# Patient Record
Sex: Male | Born: 1980 | Race: Black or African American | Hispanic: No | Smoking: Current every day smoker
Health system: Southern US, Community
[De-identification: ages and names within clinical notes are randomized; demographics above are authoritative.]

## PROBLEM LIST (undated history)

## (undated) DIAGNOSIS — W3400XA Accidental discharge from unspecified firearms or gun, initial encounter: Secondary | ICD-10-CM

## (undated) HISTORY — PX: OTHER SURGICAL HISTORY: SHX169

---

## 2005-09-21 ENCOUNTER — Ambulatory Visit: Payer: Self-pay | Admitting: Cardiology

## 2005-09-23 ENCOUNTER — Emergency Department (HOSPITAL_COMMUNITY): Admission: EM | Admit: 2005-09-23 | Discharge: 2005-09-23 | Payer: Self-pay | Admitting: Emergency Medicine

## 2005-10-12 ENCOUNTER — Emergency Department (HOSPITAL_COMMUNITY): Admission: EM | Admit: 2005-10-12 | Discharge: 2005-10-12 | Payer: Self-pay | Admitting: *Deleted

## 2005-12-24 ENCOUNTER — Emergency Department (HOSPITAL_COMMUNITY): Admission: EM | Admit: 2005-12-24 | Discharge: 2005-12-25 | Payer: Self-pay | Admitting: Emergency Medicine

## 2006-12-11 ENCOUNTER — Emergency Department (HOSPITAL_COMMUNITY): Admission: EM | Admit: 2006-12-11 | Discharge: 2006-12-11 | Payer: Self-pay | Admitting: Emergency Medicine

## 2006-12-23 ENCOUNTER — Emergency Department (HOSPITAL_COMMUNITY): Admission: EM | Admit: 2006-12-23 | Discharge: 2006-12-23 | Payer: Self-pay | Admitting: Emergency Medicine

## 2010-06-08 ENCOUNTER — Emergency Department (HOSPITAL_COMMUNITY): Admission: EM | Admit: 2010-06-08 | Discharge: 2010-06-08 | Payer: Self-pay | Admitting: Emergency Medicine

## 2010-10-24 ENCOUNTER — Emergency Department (HOSPITAL_COMMUNITY)
Admission: EM | Admit: 2010-10-24 | Discharge: 2010-10-24 | Payer: Self-pay | Source: Home / Self Care | Admitting: Emergency Medicine

## 2011-04-06 NOTE — Consult Note (Signed)
NAME:  CAPERS, HAGMANN NO.:  [id number]   MEDICAL RECORD NO.:  [id number]          PATIENT TYPE:  EMS   LOCATION:  MAJO                         FACILITY:  [facility]   PHYSICIAN:  Anna Genre. Maisie Fus, M.D. [facility] OF BIRTH:  15-Dec-1980   DATE OF CONSULTATION:  09/23/2005  DATE OF DISCHARGE:                                   CONSULTATION   CHIEF COMPLAINT:  Questionable seizure activity.   HISTORY OF PRESENT ILLNESS:  Mr. Huot is a 30 year old gentleman with a  history of polysubstance abuse, notably cocaine use, who was brought to the  emergency department by EMS after his wife called out of concerns of a  possible seizure. The patient is unable to report significant contribution  to his history, but his wife indicates that he had been using cocaine  heavily over the last several days, when she noticed a loud noise last night  that prompted her to evaluate the situation. She found her husband writhing  around on the floor, appearing agitated with nonpurposeful movements that  she thought were consistent with seizure activity. He also was noted to be  incontinent, however, did not have any tongue biting. He denied any  significant complaints of chest pain during this episode. We are asked to  evaluate the patient based on the findings of his electrocardiogram.   PAST MEDICAL HISTORY:  Notable for an Axis I psychiatric diagnoses,  prompting treatment with valproic acid. Otherwise, essentially unremarkable  with the exception of his cocaine use.   MEDICATIONS:  The patient takes valproic acid. The dose of his medication is  not known at this time.   SOCIAL HISTORY:  The patient was recently discharged from prison. He is not  currently working. He is married and his wife has 4 children. He uses  alcohol intermittently and smokes cigarettes intermittently as well.   ALLERGIES:  No known drug allergies.   REVIEW OF SYSTEMS:  Notable for the neurologic symptoms reported  above.  Additionally, the patient has had some weight loss over the last several  months. Otherwise, remainder of review of systems is negative.   PHYSICAL EXAMINATION:  VITAL SIGNS:  Blood pressure today in the emergency  department was 132/68. His pulse was regular at 88.  LUNGS:  Clear to auscultation bilaterally. His JVP was not elevated.  CARDIOVASCULAR:  Examination showed an S3 with 1/6 systolic ejection murmur.  Otherwise, normal S1 and S2.  ABDOMEN:  Soft, nontender. Good bowel sounds.  EXTREMITIES:  No edema.  NEUROLOGIC:  He was somnolent, however, arousable. However, I could not get  the patient to string together intelligible words. Of note, he did receive  Ativan in the emergency department prior to my assessment of him.   LABORATORY DATA:  Head CT performed showed no evidence of any acute  intracranial process.   Chest x-ray was unremarkable.   His EKG showed normal sinus rhythm with a normal axis. There was significant  evidence voltage LVH criteria was met and patient had evidence of early  repolarization in multiple leads and had associated ST-T wave changes.  Overall impression  of the electrocardiogram was normal variant for a young  African-American male with ST-T wave changes recognized to be consistent  with this demographic.   IMPRESSION AND PLAN:  A 30 year old gentleman actively using cocaine. No  complaints of cardiac issues at this time with an EKG consistent with early  repolarization, left ventricular hypertrophy in a young African-American  male. His cardiac enzymes were evaluated. His troponin I and CK, CK-MB  remained negative during the point of care assessment. Myoglobin was  positive, consistent with the muscle breakdown that occurred with his  questionable seizure activity. From a cardiovascular standpoint, there are  no further recommendations from our standpoint. His EKG is considered within  the limits of normal for this particular  individual.   Thanks for the consultation.           ______________________________  Anna Genre Maisie Fus, M.D. [facility]     KLT/MEDQ  D:  09/23/2005  T:  09/23/2005  Job:  [id number]

## 2015-03-05 ENCOUNTER — Emergency Department (HOSPITAL_BASED_OUTPATIENT_CLINIC_OR_DEPARTMENT_OTHER)
Admission: EM | Admit: 2015-03-05 | Discharge: 2015-03-05 | Disposition: A | Discharge status: Home / Self Care | Payer: Medicaid Other | Attending: Emergency Medicine | Admitting: Emergency Medicine

## 2015-03-05 ENCOUNTER — Emergency Department (HOSPITAL_BASED_OUTPATIENT_CLINIC_OR_DEPARTMENT_OTHER): Payer: Medicaid Other

## 2015-03-05 ENCOUNTER — Encounter (HOSPITAL_BASED_OUTPATIENT_CLINIC_OR_DEPARTMENT_OTHER): Payer: Self-pay | Admitting: *Deleted

## 2015-03-05 DIAGNOSIS — Y998 Other external cause status: Secondary | ICD-10-CM | POA: Insufficient documentation

## 2015-03-05 DIAGNOSIS — Y9389 Activity, other specified: Secondary | ICD-10-CM | POA: Insufficient documentation

## 2015-03-05 DIAGNOSIS — Z720 Tobacco use: Secondary | ICD-10-CM | POA: Insufficient documentation

## 2015-03-05 DIAGNOSIS — W19XXXA Unspecified fall, initial encounter: Secondary | ICD-10-CM

## 2015-03-05 DIAGNOSIS — Y9289 Other specified places as the place of occurrence of the external cause: Secondary | ICD-10-CM | POA: Insufficient documentation

## 2015-03-05 DIAGNOSIS — Z79899 Other long term (current) drug therapy: Secondary | ICD-10-CM | POA: Diagnosis not present

## 2015-03-05 DIAGNOSIS — S20212A Contusion of left front wall of thorax, initial encounter: Secondary | ICD-10-CM | POA: Diagnosis not present

## 2015-03-05 DIAGNOSIS — W1839XA Other fall on same level, initial encounter: Secondary | ICD-10-CM | POA: Diagnosis not present

## 2015-03-05 DIAGNOSIS — S299XXA Unspecified injury of thorax, initial encounter: Secondary | ICD-10-CM | POA: Diagnosis present

## 2015-03-05 MED ORDER — CYCLOBENZAPRINE HCL 10 MG PO TABS: 10.0000 mg | ORAL_TABLET | Freq: Two times a day (BID) | ORAL | Status: AC | PRN

## 2015-03-05 MED ORDER — MELOXICAM 15 MG PO TABS: 15.0000 mg | ORAL_TABLET | Freq: Every day | ORAL | Status: AC

## 2015-03-05 NOTE — ED Notes (Signed)
Patient transported to X-ray via stretcher 

## 2015-03-05 NOTE — Discharge Instructions (Signed)
Take Mobic as needed for pain. Take Flexeril as needed for muscle spasm. Refer to attached documents for more information.  °

## 2015-03-05 NOTE — ED Notes (Signed)
Pt reports fell 2 days ago on left ribs- pain worse today with breathing and cough

## 2015-03-05 NOTE — ED Notes (Signed)
Presents to ED today w/ c/o pain at Left side, after sustaing a fall this past Thursday, pt states fell on wood steps, denies hitting head, alt LOC, pt states was accidental fall, "I tripped"

## 2015-03-05 NOTE — ED Notes (Signed)
No bruising, color changes noted on Lt chest, chest excursion very good

## 2015-03-05 NOTE — ED Provider Notes (Signed)
CSN: [id number]     Arrival date & time 03/05/15  1309 History   First MD Initiated Contact with Patient 03/05/15 1334     Chief Complaint  Patient presents with  . Fall     (Consider location/radiation/quality/duration/timing/severity/associated sxs/prior Treatment) HPI Comments: Patient is a 34 year old male who presents with a 2 day history of left rib pain that started after he fell and landed on his left side. Symptoms started suddenly. The pain is aching and severe without radiation. Palpation makes the pain worse. No alleviating factors. Patient denies head trauma or LOC. He tried tylenol for pain without relief. No other associated symptoms.    History reviewed. No pertinent past medical history. History reviewed. No pertinent past surgical history. No family history on file. History  Substance Use Topics  . Smoking status: Current Every Day Smoker    Types: Cigarettes  . Smokeless tobacco: Never Used  . Alcohol Use: No    Review of Systems  Constitutional: Negative for fever, chills and fatigue.  HENT: Negative for trouble swallowing.   Eyes: Negative for visual disturbance.  Respiratory: Negative for shortness of breath.   Cardiovascular: Positive for chest pain. Negative for palpitations.  Gastrointestinal: Negative for nausea, vomiting, abdominal pain and diarrhea.  Genitourinary: Negative for dysuria and difficulty urinating.  Musculoskeletal: Negative for arthralgias and neck pain.  Skin: Negative for color change.  Neurological: Negative for dizziness and weakness.  Psychiatric/Behavioral: Negative for dysphoric mood.      Allergies  Review of patient's allergies indicates no known allergies.  Home Medications   Prior to Admission medications   Medication Sig Start Date End Date Taking? Authorizing Provider  cetirizine (ZYRTEC) 10 MG tablet Take 10 mg by mouth daily.   Yes Historical Provider, MD   BP 120/69 mmHg  Pulse 71  Temp(Src) 97.9 F (36.6 C)  (Oral)  Resp 20  Ht 5\' 3"  (1.6 m)  Wt 135 lb (61.236 kg)  BMI 23.92 kg/m2  SpO2 100% Physical Exam  Constitutional: He is oriented to person, place, and time. He appears well-developed and well-nourished. No distress.  HENT:  Head: Normocephalic and atraumatic.  Eyes: Conjunctivae and EOM are normal.  Neck: Normal range of motion.  Cardiovascular: Normal rate and regular rhythm.  Exam reveals no gallop and no friction rub.   No murmur heard. Pulmonary/Chest: Effort normal and breath sounds normal. He has no wheezes. He has no rales. He exhibits tenderness.  Left lateral lower rib tenderness to palpation. No obvious deformity.   Abdominal: Soft. He exhibits no distension. There is no tenderness. There is no rebound.  Musculoskeletal: Normal range of motion.  Neurological: He is alert and oriented to person, place, and time. Coordination normal.  Speech is goal-oriented. Moves limbs without ataxia.   Skin: Skin is warm and dry.  Psychiatric: He has a normal mood and affect. His behavior is normal.  Nursing note and vitals reviewed.   ED Course  Procedures (including critical care time) Labs Review Labs Reviewed - No data to display  Imaging Review Dg Chest 2 View  03/05/2015   CLINICAL DATA:  Larey SeatFell 2 days ago and injured left sided chest.  EXAM: CHEST  2 VIEW  COMPARISON:  12/23/2006  FINDINGS: The heart size and mediastinal contours are within normal limits. Both lungs are clear. The visualized skeletal structures are unremarkable.  IMPRESSION: Normal chest x-ray.   Electronically Signed   By: Rudie MeyerP.  Gallerani M.D.   On: 03/05/2015 13:53  EKG Interpretation None      MDM   Final diagnoses:  Fall, initial encounter  Chest wall contusion, left, initial encounter    2:35 PM Chest xray shows no fracture. Vitals stable and patient afebrile. Patient will be discharged with mobic and flexeril for pain. No other injury.   Emilia Beck, PA-C 03/05/15 1445  Toy Cookey, MD 03/06/15 [phone]

## 2017-03-21 ENCOUNTER — Encounter (HOSPITAL_BASED_OUTPATIENT_CLINIC_OR_DEPARTMENT_OTHER): Payer: Self-pay | Admitting: *Deleted

## 2017-03-21 ENCOUNTER — Emergency Department (HOSPITAL_BASED_OUTPATIENT_CLINIC_OR_DEPARTMENT_OTHER): Payer: Medicaid Other

## 2017-03-21 ENCOUNTER — Emergency Department (HOSPITAL_BASED_OUTPATIENT_CLINIC_OR_DEPARTMENT_OTHER)
Admission: EM | Admit: 2017-03-21 | Discharge: 2017-03-21 | Disposition: A | Discharge status: Home / Self Care | Payer: Medicaid Other | Attending: Dermatology | Admitting: Dermatology

## 2017-03-21 DIAGNOSIS — Y999 Unspecified external cause status: Secondary | ICD-10-CM | POA: Insufficient documentation

## 2017-03-21 DIAGNOSIS — F17210 Nicotine dependence, cigarettes, uncomplicated: Secondary | ICD-10-CM | POA: Diagnosis not present

## 2017-03-21 DIAGNOSIS — S6992XA Unspecified injury of left wrist, hand and finger(s), initial encounter: Secondary | ICD-10-CM | POA: Insufficient documentation

## 2017-03-21 DIAGNOSIS — Y92410 Unspecified street and highway as the place of occurrence of the external cause: Secondary | ICD-10-CM | POA: Diagnosis not present

## 2017-03-21 DIAGNOSIS — Y939 Activity, unspecified: Secondary | ICD-10-CM | POA: Insufficient documentation

## 2017-03-21 DIAGNOSIS — Z5321 Procedure and treatment not carried out due to patient leaving prior to being seen by health care provider: Secondary | ICD-10-CM | POA: Insufficient documentation

## 2017-03-21 HISTORY — DX: Accidental discharge from unspecified firearms or gun, initial encounter: W34.00XA

## 2017-03-21 NOTE — ED Notes (Signed)
Called name 2x no answer.
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
## 2017-03-21 NOTE — ED Provider Notes (Cosign Needed)
Signed up to see patient, however called from waiting room x2 without answer.  I did not get to evaluate patient.   Garlon HatchetLisa M Seanne Chirico, PA-C 03/21/17 [phone]

## 2018-01-11 ENCOUNTER — Emergency Department (HOSPITAL_BASED_OUTPATIENT_CLINIC_OR_DEPARTMENT_OTHER)
Admission: EM | Admit: 2018-01-11 | Discharge: 2018-01-11 | Disposition: A | Discharge status: Home / Self Care | Payer: Medicaid Other | Attending: Emergency Medicine | Admitting: Emergency Medicine

## 2018-01-11 ENCOUNTER — Encounter (HOSPITAL_BASED_OUTPATIENT_CLINIC_OR_DEPARTMENT_OTHER): Payer: Self-pay | Admitting: Emergency Medicine

## 2018-01-11 ENCOUNTER — Emergency Department (HOSPITAL_BASED_OUTPATIENT_CLINIC_OR_DEPARTMENT_OTHER): Payer: Medicaid Other

## 2018-01-11 ENCOUNTER — Other Ambulatory Visit: Payer: Self-pay

## 2018-01-11 DIAGNOSIS — R0602 Shortness of breath: Secondary | ICD-10-CM | POA: Diagnosis present

## 2018-01-11 DIAGNOSIS — F17210 Nicotine dependence, cigarettes, uncomplicated: Secondary | ICD-10-CM | POA: Insufficient documentation

## 2018-01-11 DIAGNOSIS — Z79899 Other long term (current) drug therapy: Secondary | ICD-10-CM | POA: Insufficient documentation

## 2018-01-11 DIAGNOSIS — J111 Influenza due to unidentified influenza virus with other respiratory manifestations: Secondary | ICD-10-CM | POA: Insufficient documentation

## 2018-01-11 DIAGNOSIS — R69 Illness, unspecified: Secondary | ICD-10-CM

## 2018-01-11 MED ORDER — ACETAMINOPHEN 500 MG PO TABS: 1000.0000 mg | ORAL_TABLET | Freq: Four times a day (QID) | ORAL | 0 refills | Status: AC | PRN

## 2018-01-11 MED ORDER — DEXTROMETHORPHAN-GUAIFENESIN 10-100 MG/5ML PO LIQD: 10.0000 mL | Freq: Four times a day (QID) | ORAL | 0 refills | Status: AC | PRN

## 2018-01-11 NOTE — ED Triage Notes (Signed)
States," Since I had my surgery 2 days ago, I have been congested and I can't breath " Also chills and h/a. Last took ibuprofen 400mg  at 0100

## 2018-01-11 NOTE — ED Notes (Signed)
Patient transported to X-ray 

## 2018-01-11 NOTE — ED Notes (Signed)
ED Provider at bedside. 
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
## 2020-08-21 ENCOUNTER — Encounter (HOSPITAL_COMMUNITY): Payer: Self-pay | Admitting: *Deleted

## 2020-08-21 ENCOUNTER — Emergency Department (HOSPITAL_COMMUNITY): Payer: Medicaid Other

## 2020-08-21 ENCOUNTER — Emergency Department (HOSPITAL_COMMUNITY)
Admission: EM | Admit: 2020-08-21 | Discharge: 2020-08-21 | Disposition: A | Discharge status: Home / Self Care | Payer: Medicaid Other | Attending: Emergency Medicine | Admitting: Emergency Medicine

## 2020-08-21 ENCOUNTER — Other Ambulatory Visit: Payer: Self-pay

## 2020-08-21 DIAGNOSIS — Z20822 Contact with and (suspected) exposure to covid-19: Secondary | ICD-10-CM | POA: Diagnosis not present

## 2020-08-21 DIAGNOSIS — S299XXA Unspecified injury of thorax, initial encounter: Secondary | ICD-10-CM | POA: Diagnosis present

## 2020-08-21 DIAGNOSIS — Y92821 Forest as the place of occurrence of the external cause: Secondary | ICD-10-CM | POA: Diagnosis not present

## 2020-08-21 DIAGNOSIS — F17210 Nicotine dependence, cigarettes, uncomplicated: Secondary | ICD-10-CM | POA: Diagnosis not present

## 2020-08-21 DIAGNOSIS — W010XXA Fall on same level from slipping, tripping and stumbling without subsequent striking against object, initial encounter: Secondary | ICD-10-CM | POA: Insufficient documentation

## 2020-08-21 DIAGNOSIS — S3991XA Unspecified injury of abdomen, initial encounter: Secondary | ICD-10-CM | POA: Insufficient documentation

## 2020-08-21 DIAGNOSIS — S0990XA Unspecified injury of head, initial encounter: Secondary | ICD-10-CM | POA: Diagnosis not present

## 2020-08-21 DIAGNOSIS — S2242XA Multiple fractures of ribs, left side, initial encounter for closed fracture: Secondary | ICD-10-CM | POA: Diagnosis not present

## 2020-08-21 DIAGNOSIS — W14XXXA Fall from tree, initial encounter: Secondary | ICD-10-CM | POA: Insufficient documentation

## 2020-08-21 DIAGNOSIS — S8012XA Contusion of left lower leg, initial encounter: Secondary | ICD-10-CM | POA: Insufficient documentation

## 2020-08-21 DIAGNOSIS — S40812A Abrasion of left upper arm, initial encounter: Secondary | ICD-10-CM | POA: Insufficient documentation

## 2020-08-21 DIAGNOSIS — T148XXA Other injury of unspecified body region, initial encounter: Secondary | ICD-10-CM

## 2020-08-21 LAB — COMPREHENSIVE METABOLIC PANEL
ALT: 30 U/L (ref 0–44)
AST: 136 U/L — ABNORMAL HIGH (ref 15–41)
Albumin: 4.5 g/dL (ref 3.5–5.0)
Alkaline Phosphatase: 59 U/L (ref 38–126)
Anion gap: 10 (ref 5–15)
BUN: 15 mg/dL (ref 6–20)
CO2: 25 mmol/L (ref 22–32)
Calcium: 9 mg/dL (ref 8.9–10.3)
Chloride: 102 mmol/L (ref 98–111)
Creatinine, Ser: 1.67 mg/dL — ABNORMAL HIGH (ref 0.61–1.24)
GFR calc Af Amer: 59 mL/min — ABNORMAL LOW (ref >60)
GFR calc non Af Amer: 51 mL/min — ABNORMAL LOW (ref >60)
Glucose, Bld: 96 mg/dL (ref 70–99)
Potassium: 3.9 mmol/L (ref 3.5–5.1)
Sodium: 137 mmol/L (ref 135–145)
Total Bilirubin: 0.9 mg/dL (ref 0.3–1.2)
Total Protein: 8.2 g/dL — ABNORMAL HIGH (ref 6.5–8.1)

## 2020-08-21 LAB — SAMPLE TO BLOOD BANK

## 2020-08-21 LAB — CBC
HCT: 44.3 % (ref 39.0–52.0)
Hemoglobin: 14.7 g/dL (ref 13.0–17.0)
MCH: 30.5 pg (ref 26.0–34.0)
MCHC: 33.2 g/dL (ref 30.0–36.0)
MCV: 91.9 fL (ref 80.0–100.0)
Platelets: 301 10*3/uL (ref 150–400)
RBC: 4.82 MIL/uL (ref 4.22–5.81)
RDW: 12.9 % (ref 11.5–15.5)
WBC: 12.8 10*3/uL — ABNORMAL HIGH (ref 4.0–10.5)
nRBC: 0 % (ref 0.0–0.2)

## 2020-08-21 LAB — I-STAT CHEM 8, ED
BUN: 16 mg/dL (ref 6–20)
Calcium, Ion: 1.09 mmol/L — ABNORMAL LOW (ref 1.15–1.40)
Chloride: 101 mmol/L (ref 98–111)
Creatinine, Ser: 1.7 mg/dL — ABNORMAL HIGH (ref 0.61–1.24)
Glucose, Bld: 94 mg/dL (ref 70–99)
HCT: 47 % (ref 39.0–52.0)
Hemoglobin: 16 g/dL (ref 13.0–17.0)
Potassium: 3.9 mmol/L (ref 3.5–5.1)
Sodium: 140 mmol/L (ref 135–145)
TCO2: 27 mmol/L (ref 22–32)

## 2020-08-21 LAB — PROTIME-INR
INR: 1.1 (ref 0.8–1.2)
Prothrombin Time: 13.5 seconds (ref 11.4–15.2)

## 2020-08-21 LAB — ETHANOL: Alcohol, Ethyl (B): <10 mg/dL (ref <10)

## 2020-08-21 LAB — RESPIRATORY PANEL BY RT PCR (FLU A&B, COVID)
Influenza A by PCR: NEGATIVE
Influenza B by PCR: NEGATIVE
SARS Coronavirus 2 by RT PCR: NEGATIVE

## 2020-08-21 MED ORDER — OXYCODONE-ACETAMINOPHEN 5-325 MG PO TABS: 1.0000 | ORAL_TABLET | Freq: Four times a day (QID) | ORAL | 0 refills | Status: AC | PRN

## 2020-08-21 MED ORDER — LIDOCAINE 5 % EX PTCH: 1.0000 | MEDICATED_PATCH | CUTANEOUS | 0 refills | Status: AC

## 2020-08-21 MED ORDER — FENTANYL CITRATE (PF) 100 MCG/2ML IJ SOLN
50.0000 ug | INTRAMUSCULAR | Status: DC | PRN
  Administered 2020-08-21 (×2): 50 ug via INTRAVENOUS
  Filled 2020-08-21 (×2): qty 2

## 2020-08-21 MED ORDER — IOHEXOL 300 MG/ML  SOLN
100.0000 mL | Freq: Once | INTRAMUSCULAR | Status: AC | PRN
  Administered 2020-08-21: 100 mL via INTRAVENOUS

## 2020-08-21 MED ORDER — LIDOCAINE 5 % EX PTCH
1.0000 | MEDICATED_PATCH | CUTANEOUS | Status: DC
  Administered 2020-08-21: 1 via TRANSDERMAL
  Filled 2020-08-21: qty 1

## 2020-08-21 MED ORDER — FENTANYL CITRATE (PF) 100 MCG/2ML IJ SOLN
50.0000 ug | Freq: Once | INTRAMUSCULAR | Status: AC
  Administered 2020-08-21: 50 ug via INTRAVENOUS
  Filled 2020-08-21: qty 2

## 2020-08-21 MED ORDER — AZITHROMYCIN 250 MG PO TABS: 250.0000 mg | ORAL_TABLET | Freq: Every day | ORAL | 0 refills | Status: AC

## 2020-08-21 MED ORDER — ONDANSETRON HCL 4 MG/2ML IJ SOLN: 4.0000 mg | Freq: Once | INTRAMUSCULAR | Status: DC | PRN

## 2020-08-21 MED ORDER — OXYCODONE-ACETAMINOPHEN 5-325 MG PO TABS
1.0000 | ORAL_TABLET | Freq: Once | ORAL | Status: AC
  Administered 2020-08-21: 1 via ORAL
  Filled 2020-08-21: qty 1

## 2020-08-21 MED ORDER — LACTATED RINGERS IV BOLUS
1000.0000 mL | Freq: Once | INTRAVENOUS | Status: AC
  Administered 2020-08-21: 1000 mL via INTRAVENOUS

## 2020-08-21 MED ORDER — SODIUM CHLORIDE (PF) 0.9 % IJ SOLN
INTRAMUSCULAR | Status: AC
  Filled 2020-08-21: qty 50

## 2020-08-21 NOTE — Discharge Instructions (Addendum)
If you develop fevers, shortness of breath, severe uncontrolled pain, or have any other concerns please seek additional medical care and evaluation.  Please take Ibuprofen (Advil, motrin) and Tylenol (acetaminophen) to relieve your pain.  You may take up to 600 MG (3 pills) of normal strength ibuprofen every 8 hours as needed.  In between doses of ibuprofen you make take tylenol, up to 1,000 mg (two extra strength pills).  Do not take more than 3,000 mg tylenol in a 24 hour period.  Please check all medication labels as many medications such as pain and cold medications may contain tylenol.  Do not drink alcohol while taking these medications.  Do not take other NSAID'S while taking ibuprofen (such as aleve or naproxen).  Please take ibuprofen with food to decrease stomach upset.  Today you received medications that may make you sleepy or impair your ability to make decisions.  For the next 24 hours please do not drive, operate heavy machinery, care for a small child with out another adult present, or perform any activities that may cause harm to you or someone else if you were to fall asleep or be impaired.   You are being prescribed a medication which may make you sleepy. Please follow up of listed precautions for at least 24 hours after taking one dose.  As we discussed your CT scan showed what appears to be either developing Covid in your lungs or a atypical appearing bacterial infection.  I have given you a prescription for azithromycin which is an antibiotic.  Please quarantine yourself at home for the next 4 to 5 days.
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
## 2020-08-21 NOTE — ED Notes (Signed)
Made RN aware that I need a new sample for Chem 8 because of the results showed bad sample
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
## 2023-05-28 ENCOUNTER — Other Ambulatory Visit: Payer: Self-pay

## 2023-05-28 ENCOUNTER — Emergency Department (HOSPITAL_BASED_OUTPATIENT_CLINIC_OR_DEPARTMENT_OTHER)
Admission: EM | Admit: 2023-05-28 | Discharge: 2023-05-28 | Disposition: A | Discharge status: Home / Self Care | Payer: MEDICAID | Attending: Emergency Medicine | Admitting: Emergency Medicine

## 2023-05-28 ENCOUNTER — Encounter (HOSPITAL_BASED_OUTPATIENT_CLINIC_OR_DEPARTMENT_OTHER): Payer: Self-pay | Admitting: Pediatrics

## 2023-05-28 DIAGNOSIS — R1084 Generalized abdominal pain: Secondary | ICD-10-CM | POA: Insufficient documentation

## 2023-05-28 DIAGNOSIS — R197 Diarrhea, unspecified: Secondary | ICD-10-CM | POA: Diagnosis not present

## 2023-05-28 DIAGNOSIS — R112 Nausea with vomiting, unspecified: Secondary | ICD-10-CM | POA: Diagnosis not present

## 2023-05-28 LAB — CBC WITH DIFFERENTIAL/PLATELET
Abs Immature Granulocytes: 0.01 10*3/uL (ref 0.00–0.07)
Basophils Absolute: 0 10*3/uL (ref 0.0–0.1)
Basophils Relative: 1 %
Eosinophils Absolute: 0.3 10*3/uL (ref 0.0–0.5)
Eosinophils Relative: 5 %
HCT: 41.8 % (ref 39.0–52.0)
Hemoglobin: 13.7 g/dL (ref 13.0–17.0)
Immature Granulocytes: 0 %
Lymphocytes Relative: 36 %
Lymphs Abs: 1.9 10*3/uL (ref 0.7–4.0)
MCH: 30.3 pg (ref 26.0–34.0)
MCHC: 32.8 g/dL (ref 30.0–36.0)
MCV: 92.5 fL (ref 80.0–100.0)
Monocytes Absolute: 0.9 10*3/uL (ref 0.1–1.0)
Monocytes Relative: 18 %
Neutro Abs: 2.1 10*3/uL (ref 1.7–7.7)
Neutrophils Relative %: 40 %
Platelets: 272 10*3/uL (ref 150–400)
RBC: 4.52 MIL/uL (ref 4.22–5.81)
RDW: 13 % (ref 11.5–15.5)
WBC: 5.1 10*3/uL (ref 4.0–10.5)
nRBC: 0 % (ref 0.0–0.2)

## 2023-05-28 LAB — COMPREHENSIVE METABOLIC PANEL
ALT: 15 U/L (ref 0–44)
AST: 20 U/L (ref 15–41)
Albumin: 3.6 g/dL (ref 3.5–5.0)
Alkaline Phosphatase: 54 U/L (ref 38–126)
Anion gap: 9 (ref 5–15)
BUN: 11 mg/dL (ref 6–20)
CO2: 22 mmol/L (ref 22–32)
Calcium: 8.3 mg/dL — ABNORMAL LOW (ref 8.9–10.3)
Chloride: 104 mmol/L (ref 98–111)
Creatinine, Ser: 1.1 mg/dL (ref 0.61–1.24)
GFR, Estimated: >60 mL/min (ref >60)
Glucose, Bld: 105 mg/dL — ABNORMAL HIGH (ref 70–99)
Potassium: 3.8 mmol/L (ref 3.5–5.1)
Sodium: 135 mmol/L (ref 135–145)
Total Bilirubin: 0.5 mg/dL (ref 0.3–1.2)
Total Protein: 7.2 g/dL (ref 6.5–8.1)

## 2023-05-28 LAB — LIPASE, BLOOD: Lipase: 27 U/L (ref 11–51)

## 2023-05-28 MED ORDER — SODIUM CHLORIDE 0.9 % IV BOLUS
500.0000 mL | Freq: Once | INTRAVENOUS | Status: AC
  Administered 2023-05-28: 500 mL via INTRAVENOUS

## 2023-05-28 MED ORDER — FAMOTIDINE IN NACL 20-0.9 MG/50ML-% IV SOLN
20.0000 mg | Freq: Once | INTRAVENOUS | Status: AC
  Administered 2023-05-28: 20 mg via INTRAVENOUS
  Filled 2023-05-28: qty 50

## 2023-05-28 MED ORDER — ONDANSETRON HCL 4 MG PO TABS: 4.0000 mg | ORAL_TABLET | Freq: Three times a day (TID) | ORAL | 0 refills | Status: AC | PRN

## 2023-05-28 MED ORDER — KETOROLAC TROMETHAMINE 30 MG/ML IJ SOLN
30.0000 mg | Freq: Once | INTRAMUSCULAR | Status: AC
  Administered 2023-05-28: 30 mg via INTRAVENOUS
  Filled 2023-05-28: qty 1

## 2023-05-28 MED ORDER — ONDANSETRON HCL 4 MG/2ML IJ SOLN
4.0000 mg | Freq: Once | INTRAMUSCULAR | Status: AC
  Administered 2023-05-28: 4 mg via INTRAVENOUS
  Filled 2023-05-28: qty 2

## 2023-05-28 NOTE — ED Triage Notes (Signed)
C/O generalized abd pain, N/V, diarrhea x 3 episodes after eating chicken yesterday.

## 2023-05-28 NOTE — ED Provider Notes (Signed)
[place] [facility] Provider Note   CSN: [id number] Arrival date & time: 05/28/23  4098     History  Chief Complaint  Patient presents with   Abdominal Pain   Emesis   Diarrhea    Brady Garner is a 42 y.o. male.  He has no significant past medical history.  He is complaining of sharp abdominal pain 3 episodes of vomiting and multiple loose stools after eating chicken yesterday.  He tried some Pepto-Bismol without any improvement.  No blood in the vomitus.  No fever.  Rates the pain a 6 out of 10.  The history is provided by the patient.  Abdominal Pain Pain location:  Generalized Pain quality: sharp   Pain severity:  Moderate Onset quality:  Gradual Duration:  1 day Timing:  Constant Progression:  Unchanged Chronicity:  New Context: suspicious food intake   Relieved by:  Nothing Worsened by:  Nothing Ineffective treatments:  OTC medications Associated symptoms: no chest pain, no hematuria and no shortness of breath        Home Medications Prior to Admission medications   Medication Sig Start Date End Date Taking? Authorizing Provider  acetaminophen (TYLENOL) 500 MG tablet Take 2 tablets (1,000 mg total) by mouth every 6 (six) hours as needed. Patient not taking: Reported on 08/21/2020 01/11/18   Arby Barrette, MD  azithromycin (ZITHROMAX) 250 MG tablet Take 1 tablet (250 mg total) by mouth daily. Take first 2 tablets together, then 1 every day until finished. 08/21/20   Cristina Gong, PA-C  cetirizine (ZYRTEC) 10 MG tablet Take 10 mg by mouth daily.    [provider]  cyclobenzaprine (FLEXERIL) 10 MG tablet Take 1 tablet (10 mg total) by mouth 2 (two) times daily as needed for muscle spasms. Patient not taking: Reported on 08/21/2020 03/05/15   Emilia Beck, PA-C  dextromethorphan-guaiFENesin (ROBITUSSIN-DM) 10-100 MG/5ML liquid Take 10 mLs by mouth every 6 (six) hours as needed for cough. Patient not taking:  Reported on 08/21/2020 01/11/18   Arby Barrette, MD  lidocaine (LIDODERM) 5 % Place 1 patch onto the skin daily. Remove & Discard patch after 12 hours.  Wait 12 hours prior to applying new pad. 08/21/20   Cristina Gong, PA-C  meloxicam (MOBIC) 15 MG tablet Take 1 tablet (15 mg total) by mouth daily. Patient not taking: Reported on 08/21/2020 03/05/15   Emilia Beck, PA-C  oxyCODONE-acetaminophen (PERCOCET/ROXICET) 5-325 MG tablet Take 1 tablet by mouth every 6 (six) hours as needed for severe pain. 08/21/20   Cristina Gong, PA-C      Allergies    Patient has no known allergies.    Review of Systems   Review of Systems  Constitutional:  Negative for fever.  Respiratory:  Negative for shortness of breath.   Cardiovascular:  Negative for chest pain.  Gastrointestinal:  Positive for abdominal pain, diarrhea, nausea and vomiting.  Genitourinary:  Negative for hematuria.    Physical Exam Updated Vital Signs BP 127/80 (BP Location: Right Arm)   Pulse 71   Temp 98.3 F (36.8 C)   Resp 18   Ht 5\' 3"  (1.6 m)   Wt 79.4 kg   SpO2 99%   BMI 31.00 kg/m  Physical Exam Vitals and nursing note reviewed.  Constitutional:      General: He is not in acute distress.    Appearance: Normal appearance. He is well-developed.  HENT:     Head: Normocephalic and atraumatic.  Eyes:  Conjunctiva/sclera: Conjunctivae normal.  Cardiovascular:     Rate and Rhythm: Normal rate and regular rhythm.     Heart sounds: No murmur heard. Pulmonary:     Effort: Pulmonary effort is normal. No respiratory distress.     Breath sounds: Normal breath sounds.  Abdominal:     Palpations: Abdomen is soft.     Tenderness: There is no abdominal tenderness. There is no guarding or rebound.  Musculoskeletal:        General: No deformity. Normal range of motion.     Cervical back: Neck supple.  Skin:    General: Skin is warm and dry.     Capillary Refill: Capillary refill takes less than 2 seconds.   Neurological:     General: No focal deficit present.     Mental Status: He is alert.     ED Results / Procedures / Treatments   Labs (all labs ordered are listed, but only abnormal results are displayed) Labs Reviewed  COMPREHENSIVE METABOLIC PANEL - Abnormal; Notable for the following components:      Result Value   Glucose, Bld 105 (*)    Calcium 8.3 (*)    All other components within normal limits  CBC WITH DIFFERENTIAL/PLATELET  LIPASE, BLOOD    EKG None  Radiology No results found.  Procedures Procedures    Medications Ordered in ED Medications  sodium chloride 0.9 % bolus 500 mL (has no administration in time range)  ondansetron (ZOFRAN) injection 4 mg (has no administration in time range)  famotidine (PEPCID) IVPB 20 mg premix (has no administration in time range)    ED Course/ Medical Decision Making/ A&P Clinical Course as of 05/28/23 1643  Tue May 28, 2023  1016 Patient was able to p.o. trial with improvement in his symptoms. [MB]    Clinical Course User Index [MB] Terrilee Files, MD                             Medical Decision Making Amount and/or Complexity of Data Reviewed Labs: ordered.  Risk Prescription drug management.   This patient complains of abdominal pain nausea vomiting diarrhea; this involves an extensive number of treatment Options and is a complaint that carries with it a high risk of complications and morbidity. The differential includes gastroenteritis, biliary colic, gastritis, peptic ulcer disease, pain otitis  I ordered, reviewed and interpreted labs, which included CBC normal chemistries normal LFTs normal lipase normal I ordered medication IV fluids Pepcid Zofran Toradol with improvement of symptoms and reviewed PMP when indicated. Previous records obtained and reviewed in epic no recent admissions Cardiac monitoring reviewed, normal sinus rhythm Social determinants considered, ongoing tobacco use Critical  Interventions: None  After the interventions stated above, I reevaluated the patient and found patient symptoms  to be improved and tolerating p.o. Admission and further testing considered, no indications for admission or further workup at this time.  Will start on clear liquid diet advance as tolerated.  Prescription for Zofran.  Return instructions discussed         Final Clinical Impression(s) / ED Diagnoses Final diagnoses:  Generalized abdominal pain  Nausea vomiting and diarrhea    Rx / DC Orders ED Discharge Orders     None         Terrilee Files, MD 05/28/23 1645

## 2023-05-28 NOTE — ED Notes (Signed)
Provided with crackers and sprite by [facility] RN

## 2023-05-28 NOTE — Discharge Instructions (Signed)
You were seen in the emergency department for nausea vomiting diarrhea and abdominal pain after eating chicken.  Your lab work was unremarkable.  We are prescribing you some nausea medication.  Please start with clear liquid diet and advance as tolerated.  Return to the emergency department if any worsening or concerning symptoms.

## 2023-05-28 NOTE — ED Notes (Signed)
Patient tolerated PO challenge and reports he is feeling better and ready to go home. Mother at bedside now.
